# Patient Record
Sex: Female | Born: 1950 | Race: White | Hispanic: No
Health system: Southern US, Community
[De-identification: ages and names within clinical notes are randomized; demographics above are authoritative.]

---

## 2004-10-08 ENCOUNTER — Emergency Department: Payer: Self-pay | Admitting: General Practice

## 2004-11-06 ENCOUNTER — Inpatient Hospital Stay: Payer: Self-pay | Admitting: Internal Medicine

## 2004-11-06 ENCOUNTER — Other Ambulatory Visit: Payer: Self-pay

## 2005-08-12 ENCOUNTER — Emergency Department: Payer: Self-pay | Admitting: Emergency Medicine

## 2005-11-08 ENCOUNTER — Emergency Department: Payer: Self-pay | Admitting: Emergency Medicine

## 2006-01-18 ENCOUNTER — Emergency Department: Payer: Self-pay | Admitting: Emergency Medicine

## 2007-01-29 ENCOUNTER — Ambulatory Visit: Payer: Self-pay

## 2007-06-02 ENCOUNTER — Ambulatory Visit: Payer: Self-pay | Admitting: Unknown Physician Specialty

## 2007-10-19 ENCOUNTER — Emergency Department: Payer: Self-pay | Admitting: Emergency Medicine

## 2008-04-21 ENCOUNTER — Ambulatory Visit: Payer: Self-pay | Admitting: Physician Assistant

## 2008-05-18 ENCOUNTER — Inpatient Hospital Stay: Payer: Self-pay | Admitting: Internal Medicine

## 2008-06-16 ENCOUNTER — Ambulatory Visit: Payer: Self-pay | Admitting: Obstetrics and Gynecology

## 2008-07-05 ENCOUNTER — Inpatient Hospital Stay: Payer: Self-pay | Admitting: Obstetrics and Gynecology

## 2008-12-27 ENCOUNTER — Emergency Department: Payer: Self-pay | Admitting: Emergency Medicine

## 2011-03-17 ENCOUNTER — Inpatient Hospital Stay: Payer: Self-pay | Admitting: Family Medicine

## 2012-08-18 ENCOUNTER — Emergency Department: Payer: Self-pay | Admitting: Emergency Medicine

## 2012-10-13 ENCOUNTER — Inpatient Hospital Stay: Payer: Self-pay | Admitting: Internal Medicine

## 2012-10-13 LAB — URINALYSIS, COMPLETE
Bilirubin,UR: NEGATIVE
Ketone: NEGATIVE
Ph: 6 (ref 4.5–8.0)
Protein: NEGATIVE
RBC,UR: 2 /HPF (ref 0–5)
Specific Gravity: 1.012 (ref 1.003–1.030)
WBC UR: 3 /HPF (ref 0–5)

## 2012-10-13 LAB — COMPREHENSIVE METABOLIC PANEL
Alkaline Phosphatase: 108 U/L (ref 50–136)
Bilirubin,Total: 0.4 mg/dL (ref 0.2–1.0)
Chloride: 107 mmol/L (ref 98–107)
Co2: 27 mmol/L (ref 21–32)
Creatinine: 0.65 mg/dL (ref 0.60–1.30)
EGFR (African American): >60
EGFR (Non-African Amer.): >60
Glucose: 73 mg/dL (ref 65–99)
Osmolality: 281 (ref 275–301)
SGPT (ALT): 36 U/L (ref 12–78)

## 2012-10-13 LAB — CBC WITH DIFFERENTIAL/PLATELET
Basophil %: 0.5 %
Eosinophil #: 0.3 10*3/uL (ref 0.0–0.7)
Eosinophil %: 3.4 %
HCT: 40.1 % (ref 35.0–47.0)
HGB: 13.4 g/dL (ref 12.0–16.0)
Lymphocyte %: 20.3 %
MCH: 30.4 pg (ref 26.0–34.0)
Monocyte %: 7.9 %
Neutrophil %: 67.9 %
Platelet: 226 10*3/uL (ref 150–440)
RBC: 4.41 10*6/uL (ref 3.80–5.20)
WBC: 9 10*3/uL (ref 3.6–11.0)

## 2012-10-13 LAB — APTT: Activated PTT: 34.9 secs (ref 23.6–35.9)

## 2012-10-13 LAB — PROTIME-INR: Prothrombin Time: 14 secs (ref 11.5–14.7)

## 2012-10-13 LAB — CK: CK, Total: 182 U/L (ref 21–215)

## 2012-10-14 LAB — CBC WITH DIFFERENTIAL/PLATELET
Basophil #: 0.1 10*3/uL (ref 0.0–0.1)
Basophil %: 1.2 %
HCT: 37.3 % (ref 35.0–47.0)
HGB: 12.2 g/dL (ref 12.0–16.0)
Lymphocyte %: 25.8 %
MCH: 30 pg (ref 26.0–34.0)
MCHC: 32.8 g/dL (ref 32.0–36.0)
MCV: 91 fL (ref 80–100)
Monocyte %: 10.1 %
Neutrophil #: 2.8 10*3/uL (ref 1.4–6.5)
Neutrophil %: 57.1 %
RDW: 13.5 % (ref 11.5–14.5)
WBC: 4.8 10*3/uL (ref 3.6–11.0)

## 2012-10-14 LAB — BASIC METABOLIC PANEL
Calcium, Total: 8.5 mg/dL (ref 8.5–10.1)
Chloride: 111 mmol/L — ABNORMAL HIGH (ref 98–107)
EGFR (Non-African Amer.): >60
Glucose: 85 mg/dL (ref 65–99)
Potassium: 3.6 mmol/L (ref 3.5–5.1)
Sodium: 143 mmol/L (ref 136–145)

## 2012-10-15 LAB — VANCOMYCIN, TROUGH: Vancomycin, Trough: 48 ug/mL (ref 10–20)

## 2012-10-15 LAB — URINE CULTURE

## 2012-10-16 LAB — VANCOMYCIN, TROUGH: Vancomycin, Trough: 15 ug/mL (ref 10–20)

## 2013-02-01 ENCOUNTER — Emergency Department: Payer: Self-pay | Admitting: Emergency Medicine

## 2013-02-01 LAB — URINALYSIS, COMPLETE
Bacteria: NONE SEEN
Bilirubin,UR: NEGATIVE
Blood: NEGATIVE
Glucose,UR: NEGATIVE mg/dL (ref 0–75)
Ketone: NEGATIVE
Nitrite: NEGATIVE
Ph: 7 (ref 4.5–8.0)
Specific Gravity: 1.019 (ref 1.003–1.030)
Squamous Epithelial: 5
WBC UR: 1 /HPF (ref 0–5)

## 2013-02-01 LAB — COMPREHENSIVE METABOLIC PANEL
Anion Gap: 3 — ABNORMAL LOW (ref 7–16)
Chloride: 107 mmol/L (ref 98–107)
Co2: 29 mmol/L (ref 21–32)
Creatinine: 0.76 mg/dL (ref 0.60–1.30)
EGFR (African American): >60
Glucose: 92 mg/dL (ref 65–99)
Osmolality: 277 (ref 275–301)
Potassium: 4.6 mmol/L (ref 3.5–5.1)
SGOT(AST): 29 U/L (ref 15–37)
SGPT (ALT): 27 U/L (ref 12–78)
Sodium: 139 mmol/L (ref 136–145)
Total Protein: 7.3 g/dL (ref 6.4–8.2)

## 2013-02-01 LAB — CBC
HCT: 38.5 % (ref 35.0–47.0)
HGB: 12.9 g/dL (ref 12.0–16.0)
MCH: 29.9 pg (ref 26.0–34.0)
Platelet: 173 10*3/uL (ref 150–440)
WBC: 7.8 10*3/uL (ref 3.6–11.0)

## 2013-06-15 ENCOUNTER — Ambulatory Visit: Payer: Self-pay | Admitting: Orthopedic Surgery

## 2013-06-15 LAB — BASIC METABOLIC PANEL
Calcium, Total: 9.1 mg/dL (ref 8.5–10.1)
Chloride: 107 mmol/L (ref 98–107)
Co2: 28 mmol/L (ref 21–32)
Creatinine: 1 mg/dL (ref 0.60–1.30)
EGFR (African American): >60
EGFR (Non-African Amer.): >60
Glucose: 103 mg/dL — ABNORMAL HIGH (ref 65–99)
Osmolality: 283 (ref 275–301)
Potassium: 3.5 mmol/L (ref 3.5–5.1)
Sodium: 141 mmol/L (ref 136–145)

## 2013-06-15 LAB — URINALYSIS, COMPLETE
Bilirubin,UR: NEGATIVE
Blood: NEGATIVE
Hyaline Cast: 2
Nitrite: NEGATIVE
Ph: 5 (ref 4.5–8.0)
Protein: 100
RBC,UR: 10 /HPF (ref 0–5)
Specific Gravity: 1.035 (ref 1.003–1.030)
Squamous Epithelial: 11
WBC UR: 5 /HPF (ref 0–5)

## 2013-06-15 LAB — CBC
HCT: 40.7 % (ref 35.0–47.0)
MCHC: 34 g/dL (ref 32.0–36.0)
MCV: 88 fL (ref 80–100)
Platelet: 251 10*3/uL (ref 150–440)
RDW: 13.8 % (ref 11.5–14.5)
WBC: 6.7 10*3/uL (ref 3.6–11.0)

## 2013-06-15 LAB — SEDIMENTATION RATE: Erythrocyte Sed Rate: 2 mm/hr (ref 0–30)

## 2013-06-15 LAB — APTT: Activated PTT: 30.7 secs (ref 23.6–35.9)

## 2013-06-15 LAB — PROTIME-INR
INR: 1
Prothrombin Time: 13.7 secs (ref 11.5–14.7)

## 2013-06-25 ENCOUNTER — Inpatient Hospital Stay: Payer: Self-pay | Admitting: Orthopedic Surgery

## 2013-06-25 LAB — HEMOGLOBIN
HGB: 11.9 g/dL — ABNORMAL LOW (ref 12.0–16.0)
HGB: 12.1 g/dL (ref 12.0–16.0)

## 2013-06-26 LAB — BASIC METABOLIC PANEL
Anion Gap: 4 — ABNORMAL LOW (ref 7–16)
Chloride: 111 mmol/L — ABNORMAL HIGH (ref 98–107)
Co2: 26 mmol/L (ref 21–32)
Creatinine: 0.58 mg/dL — ABNORMAL LOW (ref 0.60–1.30)
Glucose: 111 mg/dL — ABNORMAL HIGH (ref 65–99)
Osmolality: 280 (ref 275–301)
Potassium: 3.8 mmol/L (ref 3.5–5.1)
Sodium: 141 mmol/L (ref 136–145)

## 2013-06-26 LAB — HEMOGLOBIN: HGB: 10.7 g/dL — ABNORMAL LOW (ref 12.0–16.0)

## 2013-06-26 LAB — PLATELET COUNT: Platelet: 128 10*3/uL — ABNORMAL LOW (ref 150–440)

## 2013-08-12 ENCOUNTER — Ambulatory Visit: Payer: Self-pay | Admitting: Orthopedic Surgery

## 2013-09-05 ENCOUNTER — Emergency Department: Payer: Self-pay | Admitting: Emergency Medicine

## 2013-09-05 LAB — LIPASE, BLOOD: Lipase: 221 U/L (ref 73–393)

## 2013-09-05 LAB — COMPREHENSIVE METABOLIC PANEL
Albumin: 3.7 g/dL (ref 3.4–5.0)
Alkaline Phosphatase: 104 U/L (ref 50–136)
Calcium, Total: 8.6 mg/dL (ref 8.5–10.1)
Co2: 24 mmol/L (ref 21–32)
Creatinine: 0.84 mg/dL (ref 0.60–1.30)
EGFR (African American): >60
EGFR (Non-African Amer.): >60
Osmolality: 278 (ref 275–301)
SGOT(AST): 35 U/L (ref 15–37)
SGPT (ALT): 34 U/L (ref 12–78)
Total Protein: 7.2 g/dL (ref 6.4–8.2)

## 2013-09-05 LAB — CBC
HCT: 37.8 % (ref 35.0–47.0)
HGB: 12.4 g/dL (ref 12.0–16.0)
MCH: 27.4 pg (ref 26.0–34.0)
MCV: 84 fL (ref 80–100)
Platelet: 177 10*3/uL (ref 150–440)
RBC: 4.52 10*6/uL (ref 3.80–5.20)
RDW: 15.4 % — ABNORMAL HIGH (ref 11.5–14.5)
WBC: 14.3 10*3/uL — ABNORMAL HIGH (ref 3.6–11.0)

## 2013-09-07 LAB — COMPREHENSIVE METABOLIC PANEL
Alkaline Phosphatase: 96 U/L (ref 50–136)
Bilirubin,Total: 0.6 mg/dL (ref 0.2–1.0)
Calcium, Total: 8.8 mg/dL (ref 8.5–10.1)
Chloride: 105 mmol/L (ref 98–107)
Co2: 27 mmol/L (ref 21–32)
Creatinine: 0.8 mg/dL (ref 0.60–1.30)
EGFR (African American): >60
EGFR (Non-African Amer.): >60
Osmolality: 271 (ref 275–301)
Potassium: 3.6 mmol/L (ref 3.5–5.1)
SGOT(AST): 31 U/L (ref 15–37)
SGPT (ALT): 30 U/L (ref 12–78)

## 2013-09-07 LAB — CBC
HGB: 11.8 g/dL — ABNORMAL LOW (ref 12.0–16.0)
MCH: 27.2 pg (ref 26.0–34.0)
MCHC: 32.5 g/dL (ref 32.0–36.0)
RBC: 4.32 10*6/uL (ref 3.80–5.20)

## 2013-09-07 LAB — URINALYSIS, COMPLETE
Bilirubin,UR: NEGATIVE
Ketone: NEGATIVE
Nitrite: NEGATIVE
Ph: 7 (ref 4.5–8.0)
Protein: NEGATIVE
RBC,UR: 3 /HPF (ref 0–5)
Specific Gravity: 1.004 (ref 1.003–1.030)
WBC UR: 7 /HPF (ref 0–5)

## 2013-09-07 LAB — LIPASE, BLOOD: Lipase: 81 U/L (ref 73–393)

## 2013-09-08 ENCOUNTER — Inpatient Hospital Stay: Payer: Self-pay | Admitting: Surgery

## 2013-09-08 LAB — CBC WITH DIFFERENTIAL/PLATELET
Basophil %: 0.4 %
Eosinophil #: 0.1 10*3/uL (ref 0.0–0.7)
Eosinophil %: 0.5 %
HCT: 31.3 % — ABNORMAL LOW (ref 35.0–47.0)
HGB: 10.2 g/dL — ABNORMAL LOW (ref 12.0–16.0)
Lymphocyte #: 1.1 10*3/uL (ref 1.0–3.6)
Lymphocyte %: 10.3 %
MCH: 27.3 pg (ref 26.0–34.0)
MCHC: 32.7 g/dL (ref 32.0–36.0)
Monocyte #: 0.9 x10 3/mm (ref 0.2–0.9)
Monocyte %: 8.7 %
Neutrophil #: 8.4 10*3/uL — ABNORMAL HIGH (ref 1.4–6.5)
Neutrophil %: 80.1 %
Platelet: 166 10*3/uL (ref 150–440)
RBC: 3.75 10*6/uL — ABNORMAL LOW (ref 3.80–5.20)
RDW: 15.2 % — ABNORMAL HIGH (ref 11.5–14.5)
WBC: 10.4 10*3/uL (ref 3.6–11.0)

## 2013-09-09 LAB — CBC WITH DIFFERENTIAL/PLATELET
Basophil #: 0 10*3/uL (ref 0.0–0.1)
Basophil %: 0.3 %
Eosinophil %: 2.5 %
HGB: 9.3 g/dL — ABNORMAL LOW (ref 12.0–16.0)
Lymphocyte #: 0.9 10*3/uL — ABNORMAL LOW (ref 1.0–3.6)
Lymphocyte %: 12.6 %
MCH: 27.8 pg (ref 26.0–34.0)
MCHC: 33.2 g/dL (ref 32.0–36.0)
Monocyte %: 11.3 %
Neutrophil %: 73.3 %
Platelet: 156 10*3/uL (ref 150–440)
RBC: 3.34 10*6/uL — ABNORMAL LOW (ref 3.80–5.20)
WBC: 7 10*3/uL (ref 3.6–11.0)

## 2013-09-09 LAB — BASIC METABOLIC PANEL
Anion Gap: 4 — ABNORMAL LOW (ref 7–16)
BUN: 4 mg/dL — ABNORMAL LOW (ref 7–18)
Calcium, Total: 8.1 mg/dL — ABNORMAL LOW (ref 8.5–10.1)
Chloride: 104 mmol/L (ref 98–107)
Creatinine: 0.76 mg/dL (ref 0.60–1.30)
Sodium: 136 mmol/L (ref 136–145)

## 2013-09-10 LAB — CBC WITH DIFFERENTIAL/PLATELET
Basophil #: 0 10*3/uL (ref 0.0–0.1)
Eosinophil #: 0.3 10*3/uL (ref 0.0–0.7)
Eosinophil %: 4.7 %
HGB: 8.9 g/dL — ABNORMAL LOW (ref 12.0–16.0)
Lymphocyte %: 11.9 %
MCH: 27.5 pg (ref 26.0–34.0)
MCHC: 32.9 g/dL (ref 32.0–36.0)
Monocyte %: 12.4 %
Neutrophil #: 4.5 10*3/uL (ref 1.4–6.5)
Neutrophil %: 70.7 %
RBC: 3.24 10*6/uL — ABNORMAL LOW (ref 3.80–5.20)
RDW: 15.5 % — ABNORMAL HIGH (ref 11.5–14.5)

## 2013-09-10 LAB — PATHOLOGY REPORT

## 2013-09-13 LAB — CREATININE, SERUM
Creatinine: 0.84 mg/dL (ref 0.60–1.30)
EGFR (African American): >60

## 2013-09-14 LAB — CBC WITH DIFFERENTIAL/PLATELET
Basophil #: 0.1 10*3/uL (ref 0.0–0.1)
Basophil %: 0.9 %
Eosinophil %: 5.1 %
Lymphocyte #: 1.2 10*3/uL (ref 1.0–3.6)
MCH: 27.4 pg (ref 26.0–34.0)
MCV: 83 fL (ref 80–100)
Neutrophil %: 64.5 %
Platelet: 239 10*3/uL (ref 150–440)

## 2013-09-14 LAB — URINALYSIS, COMPLETE
Bilirubin,UR: NEGATIVE
Glucose,UR: NEGATIVE mg/dL (ref 0–75)
Ketone: NEGATIVE
Nitrite: NEGATIVE
RBC,UR: 16 /HPF (ref 0–5)
Specific Gravity: 1.008 (ref 1.003–1.030)
Squamous Epithelial: 4
WBC UR: 1206 /HPF (ref 0–5)

## 2013-10-16 IMAGING — CT CT OF THE LEFT KNEE WITHOUT CONTRAST
1 of 3 series · 8 of 14 positions shown, 10 images · non-contrast
Comparison: none

REASON FOR EXAM: MY KNEE CT Left knee pain Surgery planning
COMMENTS:

PROCEDURE:     KCT - KCT KNEE LEFT WO CONTRAST  - August 12, 2013  [DATE]
RESULT:
TECHNIQUE: Helical imaging was performed per a MY CT protocol.

[Series 3: knee 1.0 b30s · axial · 0.39mm/px · z∈[-200,-46]mm · 8 of 395 slices shown, 10 images]
[im 44/395  soft-tissue]
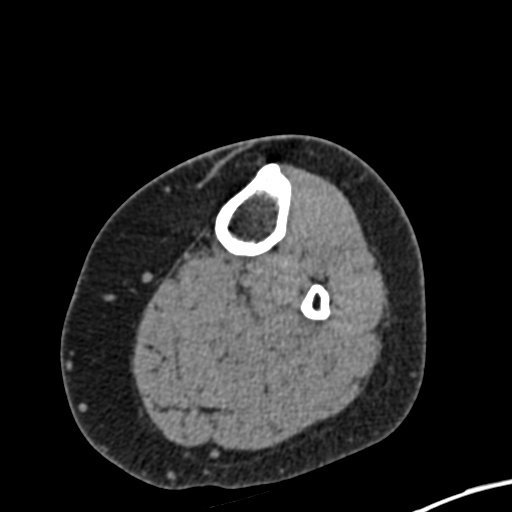
[im 44/395  bone]
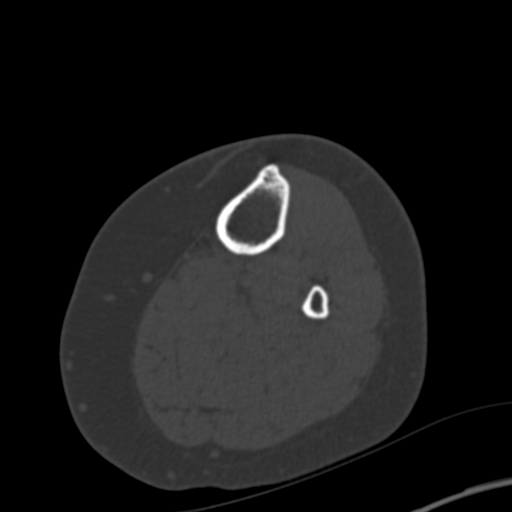
[im 88/395  bone]
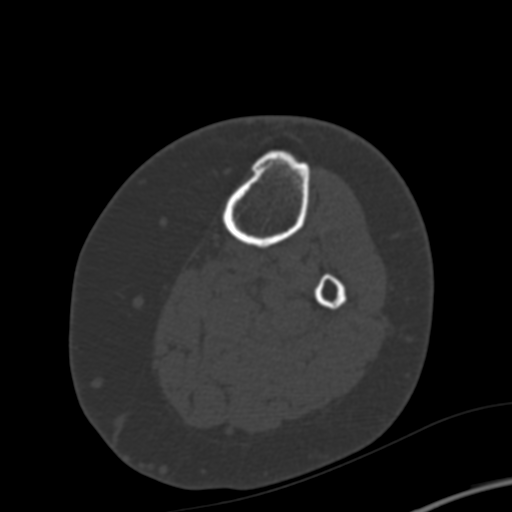
[im 132/395  bone]
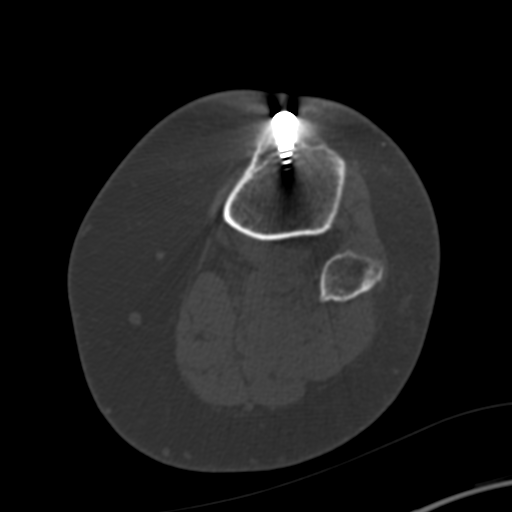
[im 176/395  bone]
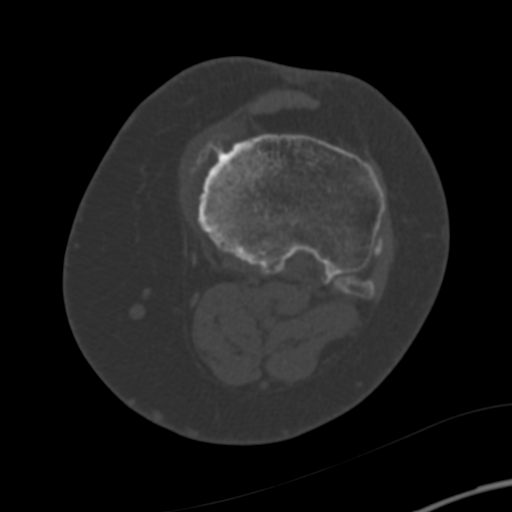
[im 219/395  soft-tissue]
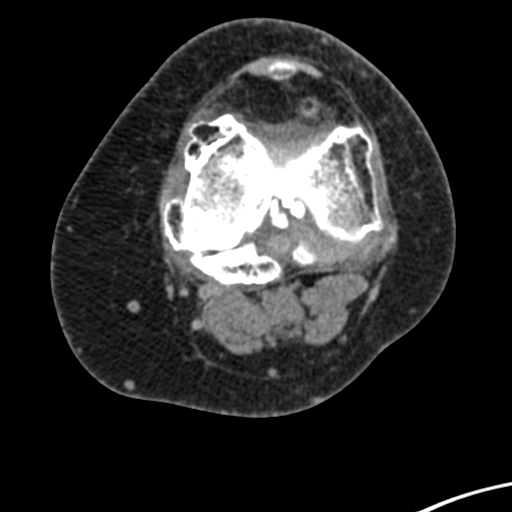
[im 219/395  bone]
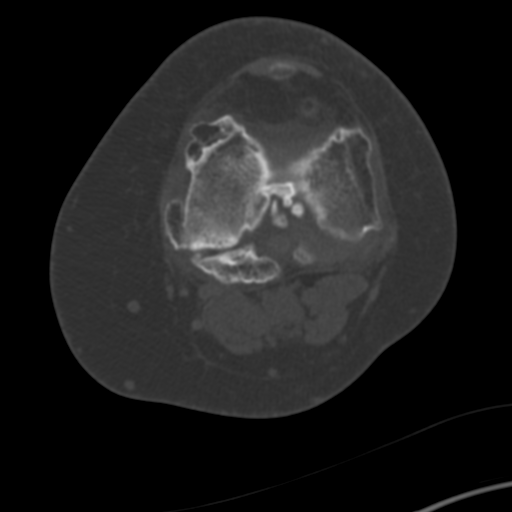
[im 263/395  bone]
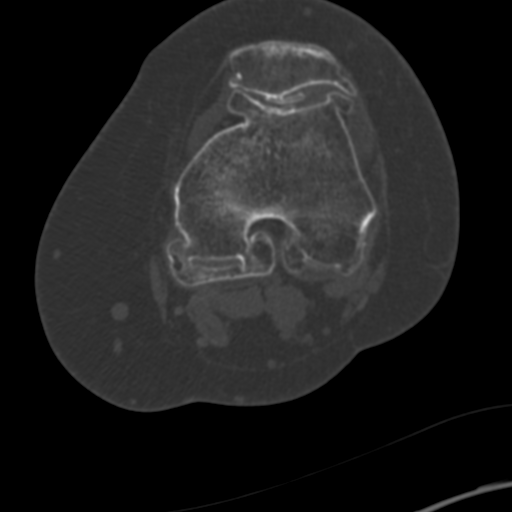
[im 307/395  bone]
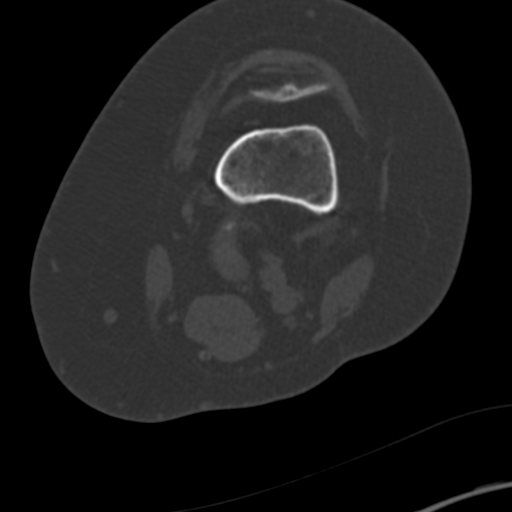
[im 351/395  bone]
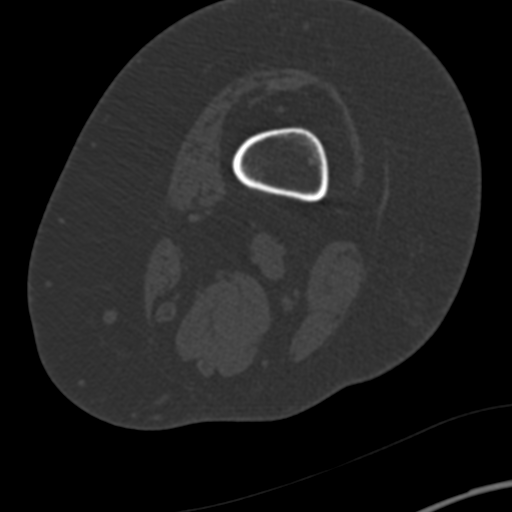

[8 of 14 positions shown; findings below may reference images not displayed]

FINDINGS: Severe degenerative changes are identified within the left knee.
Post operative screw is identified within the tibia. Degenerative changes
are identified within the left foot and ankle. There is no evidence of lytic
or blastic foci. Mild degenerative changes are identified within the left
hip.
IMPRESSION: Left knee CT per a MY knee protocol.

## 2013-10-26 ENCOUNTER — Ambulatory Visit: Payer: Self-pay | Admitting: Orthopedic Surgery

## 2013-10-26 LAB — BASIC METABOLIC PANEL
Anion Gap: 4 — ABNORMAL LOW (ref 7–16)
BUN: 7 mg/dL (ref 7–18)
Calcium, Total: 8.6 mg/dL (ref 8.5–10.1)
Chloride: 105 mmol/L (ref 98–107)
Co2: 30 mmol/L (ref 21–32)
Creatinine: 0.81 mg/dL (ref 0.60–1.30)
EGFR (African American): >60
EGFR (Non-African Amer.): >60
Glucose: 80 mg/dL (ref 65–99)
Sodium: 139 mmol/L (ref 136–145)

## 2013-10-26 LAB — URINALYSIS, COMPLETE
Blood: NEGATIVE
Glucose,UR: NEGATIVE mg/dL (ref 0–75)
Hyaline Cast: 3
RBC,UR: 3 /HPF (ref 0–5)
Squamous Epithelial: 1

## 2013-10-26 LAB — MRSA PCR SCREENING

## 2013-10-26 LAB — CBC
MCHC: 31.5 g/dL — ABNORMAL LOW (ref 32.0–36.0)
RDW: 16.3 % — ABNORMAL HIGH (ref 11.5–14.5)
WBC: 7 10*3/uL (ref 3.6–11.0)

## 2013-10-26 LAB — SEDIMENTATION RATE: Erythrocyte Sed Rate: 8 mm/hr (ref 0–30)

## 2013-11-10 ENCOUNTER — Inpatient Hospital Stay: Payer: Self-pay | Admitting: Orthopedic Surgery

## 2013-11-11 LAB — BASIC METABOLIC PANEL
BUN: 6 mg/dL — ABNORMAL LOW (ref 7–18)
Chloride: 108 mmol/L — ABNORMAL HIGH (ref 98–107)
Co2: 28 mmol/L (ref 21–32)
EGFR (African American): >60
EGFR (Non-African Amer.): >60
Potassium: 3 mmol/L — ABNORMAL LOW (ref 3.5–5.1)
Sodium: 141 mmol/L (ref 136–145)

## 2013-11-11 LAB — PLATELET COUNT: Platelet: 155 10*3/uL (ref 150–440)

## 2013-11-12 LAB — HEMOGLOBIN
HGB: 8.1 g/dL — ABNORMAL LOW (ref 12.0–16.0)
HGB: 8.7 g/dL — ABNORMAL LOW (ref 12.0–16.0)

## 2013-11-12 LAB — POTASSIUM: Potassium: 3.2 mmol/L — ABNORMAL LOW (ref 3.5–5.1)

## 2013-11-13 LAB — PATHOLOGY REPORT

## 2015-03-01 NOTE — Discharge Summary (Signed)
PATIENT NAME:  Kristen Nash, Kristen Nash MR#:  [id number] DATE OF BIRTH:  11-26-1950  DATE OF ADMISSION:  10/13/2012 DATE OF DISCHARGE:  10/17/2012  DISCHARGE DIAGNOSES:  1. Sepsis.  2. Cellulitis lower abdominal wall.  3. Vulvar abscess.  4. Hypercholesterolemia.  5. Depression.   DISCHARGE MEDICATIONS: Bactrim DS one p.o. b.i.d. x10 days. This is a new medication. Otherwise she can take: 1. Hydrocodone/Tylenol p.r.n.  for pain she has at home.  2. Continue on Celexa 40 mg daily.  3. Tylenol p.r.n.  4. Trazodone 150 at bedtime for sleep.   HISTORY AND PHYSICAL: Please see detailed History and Physical done on admission.   HOSPITAL COURSE: The patient was admitted as above, hypotensive, somewhat confused with rapidly spreading cellulitis. She was started on vancomycin and Zosyn given her history of MRSA infections. She defervesced quickly from a temperature of approximately 102 per the patient noted in the office. Fluid bolus corrected her hypotension well. Her pulse slowed down. She did very well. Gynecology was consulted and ultimately an I and D procedure was done on the abscess with a Penrose drain placed. The size of this decreased substantially. Dr. Logan BoresEvans wanted early followup of this lesion, though the drain did come out within 24 hours. The abscess was still draining, so another was not placed. She had Escherichia coli that was pansensitive growing in her urine. She had staph aureus which was methicillin resistant from the wound culture. Luckily, it was sensitive to Septra and she could take that. She will be discharged on that. Proteus from the wound culture was also sulfa sensitive. MVC was down to 4800 on today's date with the rest of the CBC being normal as well. She will follow up with me as previously planned. Dr. Logan BoresEvans will monitor this local infection further.     TIME SPENT:   It took approximately 32 minutes to do all discharge tasks today.   ____________________________ Marya AmslerMarshall W.  Dareen PianoAnderson, MD mwa:cbb D: 10/17/2012 08:04:19 ET T: 10/17/2012 12:36:57 ET JOB#: [id number]  cc: Marya AmslerMarshall W. Dareen PianoAnderson, MD, <Dictator> Lauro RegulusMARSHALL W Carmencita Cusic MD ELECTRONICALLY SIGNED 10/18/2012 9:51

## 2015-03-01 NOTE — Op Note (Signed)
PATIENT NAME:  Kristen Nash, Kristen Nash MR#:  [id number] DATE OF BIRTH:  [birth year]  DATE OF PROCEDURE:  10/15/2012  PREOPERATIVE DIAGNOSIS: Right vulvar abscess, hospital day two, on IV antibiotics.  POSTOPERATIVE DIAGNOSIS: Right vulvar abscess, hospital day two, on IV antibiotics.  PROCEDURE: Incision and drainage with placement of Penrose drain.   SURGEON: Ricky L. Logan BoresEvans, MD  ANESTHESIA: General orotracheal.   FINDINGS: Right vulvar abscess cavity shown to be approximately 7 cm in the AP plane and approximately 2 cm deep.  ESTIMATED BLOOD LOSS: Minimal.   COMPLICATIONS: None.   SPECIMENS: None sent for pathology.  INDICATION: The patient was admitted two days ago by the Medicine service for vulvar abscess with cellulitis and otherwise fairly healthy lady. She was having some purulent drainage and on discharge felt to have some fluctuance and probable mass and elected to proceed with the above procedure. Consent was signed.   DESCRIPTION OF PROCEDURE: She was taken to Operating Room and placed in the supine position where anesthesia was initiated. We then placed in shallow lithotomy position using Allen stirrups due to the patient's orthopedic surgeries on her knees.    She was prepped and draped in the usual fashion. Right vulva was visualized. The area of drainage was seen to be approximately 2 to 3 mm. This was extended to approximately a centimeter using a #15 blade and the wound was explored with hemostats. There was some clumpy purulent type material expressed and then a significant amount of serous fluid behind this. None of this was sent for permanent. Loculations were broken up, Sterile saline used to irrigate cavity until return was clear.  Penrose was placed, anchored in placed with 3-0 silk, and the areas were seen to be hemostatic.   At this point, the procedure was felt to achieve maximum efficacy. She was returned to the supine position, left in the care of anesthesia. I anticipate  a routine postoperative course.  She is on antibiotics and no additional antibiotics were given for the procedure. She is okay to go home for a GYN standpoint, and I will see her back in a week to remove this drain. ____________________________ Clide Clifficky L. Logan BoresEvans, MD rle:slb D: 10/15/2012 10:11:36 ET T: 10/15/2012 11:21:46 ET JOB#: [id number]  cc: Ricky L. Logan BoresEvans, MD, <Dictator> Augustina MoodICK L Deiondre Harrower MD ELECTRONICALLY SIGNED 10/17/2012 7:21

## 2015-03-01 NOTE — H&P (Signed)
PATIENT NAME:  Kristen SchlichterCOLLIER, Keaunna C MR#:  [id number] DATE OF BIRTH:  [birth year]  DATE OF ADMISSION:  10/13/2012  CHIEF COMPLAINT: Abdominal discomfort and swelling.   HISTORY OF PRESENT ILLNESS: The patient is a 64 year old female who was noted to have redness and swelling in her right groin over the last several days. This area has gotten larger, more tender. She has noticed some purulent material draining from it and had temperature up to 101.4 this morning. She has felt generalized malaise. No nausea, vomiting, no change in bowels, bladder. Trauma to the area. Evaluation revealed evidence of a right labial abscess with erythema extending up the abdominal wall; she was also noted to have blood pressure 82/54. She is admitted now with right labial abscess, abdominal wall cellulitis, and hypotension which is worrisome for a systemic inflammatory response syndrome versus impending sepsis. She is admitted for IV antibiotics, IV fluids, and Gynecology consultation and potential incision and drainage.   PAST MEDICAL HISTORY:  1. Depression.  2. History of headaches.  3. Allergic rhinitis.  4. Hyperlipidemia.   PAST SURGICAL HISTORY:  1. Avascular necrosis of right femur.  2. History of left knee surgery.  3. Reconstruction of pelvis August 2009.    ALLERGIES: Biaxin causes chest pain.   MEDICATIONS:  1. Aspirin 325 mg daily as needed.  2. Citalopram 40 mg p.o. daily.  3. Pravastatin 20 mg p.o. daily.  4. Vicodin 5/500 mg, 1 p.o. b.i.d. p.r.n.  5. Trazodone 150 mg p.o. at bedtime.  6. Lorazepam 0.25 mg p.o. t.i.d. p.r.n.  SOCIAL HISTORY: No alcohol or tobacco.   FAMILY HISTORY: Chronic obstructive pulmonary disease, Alzheimer's.   REVIEW OF SYSTEMS: Please see history of present illness. She denies chest pain, shortness of breath. No recent unusual headaches. No other rash. The remainder of complete review of systems is negative.   PHYSICAL EXAMINATION:  VITAL SIGNS: Weight 201, blood pressure  82/54, temperature 98.5, pulse 74.   GENERAL: Well-developed, well-nourished female, appears uncomfortable.   HEENT: Eyes: Pupils are round and reactive to light. Lids and conjunctivae are unremarkable. Ears, nose and throat: External examination unremarkable. The oropharynx is moist without lesions.   NECK: Supple, trachea midline. No thyromegaly.   CARDIOVASCULAR: Regular rate and rhythm without gallops or rubs. Carotid and radial pulses 1+.   LUNGS: Clear bilaterally. No wheeze or retraction.   ABDOMEN: Soft, nondistended, positive bowel sounds. Erythema extending up from the abdomen.   GENITALIA: Extensive swelling of the right labia majora with a single area of drainage. Discomfort throughout the area with palpation and evidence of the extensive erythema consistent with cellulitis.   LYMPH NODES: No inguinal nodes.   EXTREMITIES: No clubbing, cyanosis, or edema.   NEUROLOGIC: Cranial nerves intact. Motor strength appears to be grossly symmetrical.     IMPRESSION AND PLAN: Right labial abscess, abdominal wall cellulitis: The area is worrisome for impending sepsis and potential superficial spread. The patient also is with history of MRSA.  We will place her on vancomycin, Zosyn. Pharmacy consultation for assistance. Gynecology assistance to evaluate for potential need for incision and drainage. We defer for imaging to their office. Blood cultures are pending. We will obtain wound culture as well. Hold aspirin for now in the event the patient needs to go for a surgical debridement as noted above.   ____________________________ Lynnea FerrierBert J. Riley Papin III, MD bjk:cbb D: 10/13/2012 15:15:53 ET T: 10/13/2012 15:51:34 ET JOB#: [id number]  cc: Lynnea FerrierBert J. Kolin Erdahl III, MD, <Dictator> Daniel NonesBERT Geneieve Duell MD ELECTRONICALLY SIGNED  10/15/2012 12:51 

## 2015-03-01 NOTE — H&P (Signed)
    Subjective/Chief Complaint 64 y/o WF Admitted yeaterday bt [facility] for right-sided labial abscess Pt had temp 101+ and depressed BP -- concern for sepsis    History of Present Illness Bump on right vulva first noted 5 days ago Over next few days, rapidly increased to golf-ball size with pain.  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  left fractured knee:   tonsillectomy for OSA:   ALLERGIES:  Biaxin: Resp. Distress  "some eye drops used during surgery caused eye swelling": Unknown  Review of Systems:   Subjective/Chief Complaint vulvatr/pubic pain with purilent drainage    Fever/Chills Yes    Cough No    Sputum No    Abdominal Pain Yes    Diarrhea No    Constipation No    Nausea/Vomiting Yes    SOB/DOE No    Chest Pain No    Dysuria No    Tolerating Diet Yes    ROS all normal except as above    Medications/Allergies Reviewed Medications/Allergies reviewed   Physical Exam:   GEN no acute distress    HEENT PERRL    NECK supple    RESP normal resp effort    CARD regular rate    ABD denies tenderness    GU superpubic tenderness  purilent drainage from right majus    LYMPH negative neck, + at right groin    EXTR negative cyanosis/clubbing    PSYCH A+O to time, place, person     Assessment/Admission Diagnosis Draining right vuvlar abscess    Plan I&D tomorrow am in OR Continue Abx Consider min. 2 weeks abx after D/C Will recheck in my office 2 weeks   Electronic Signatures: Margaretha GlassingEvans, Ricky L (MD)  (Signed 03-Dec-13 12:03)  Authored: CHIEF COMPLAINT and HISTORY, PAST MEDICAL/SURGIAL HISTORY, ALLERGIES, REVIEW OF SYSTEMS, PHYSICAL EXAM, ASSESSMENT AND PLAN   Last Updated: 03-Dec-13 12:03 by Margaretha GlassingEvans, Ricky L (MD)

## 2015-03-04 NOTE — H&P (Signed)
PATIENT NAME:  Kristen, Nash MR#:  [id number] DATE OF BIRTH:  1951/02/20  DATE OF ADMISSION:  09/07/2013  PRIMARY CARE PHYSICIAN: Einar Crow.   ADMITTING PHYSICIAN: Dr. Michela Pitcher.   CHIEF COMPLAINT: Abdominal pain.   BRIEF HISTORY:  Kristen Nash is a 64 year old woman seen in the Emergency Room with three-day history of abdominal pain. She went to her church service on Saturday morning, went to a [employer], which was an all vegetarian meal, had no significant problems. Went to visit a friend that afternoon, and while talking to the friend, had sudden onset of pain in her right lower quadrant. She said it felt "as though she had been stabbed."   The pain was so severe she collapsed with likely vasovagal attack.  She presented to the emergency room for further evaluation.  She had an elevated white blood cell count of 15,000.  EKG was largely unremarkable. CT scan without p.o. contrast was performed, which was unremarkable. It did show some retention of stool on the right side, but no obvious abnormality. There were no inflammatory changes noted. The gallbladder was unremarkable. She was sent home on pain medication. The pain was severe over the last 48 hours, and she began to develop a fever last evening. This morning she woke with a temperature of 103. She presented back to the Emergency Room for further evaluation and has been in our emergency room essentially all day. White blood cell count at this point was normal. Repeat CT scan with contrast reveals what appears to be free air in the right lower quadrant/right periumbilical area. She does have a small fat-filled hernia, and there appears to be some free air in that in addition. There also appears to be a foreign body in the bowel, which may have penetrated the bowel wall. The CT is being read as possible bowel perforation, possible enteritis. The CT scan from the 25th was reviewed, and there did appear to be a similar foreign body unexplained  object on those films.   She had no significant abdominal history previously. She denies history of hepatitis, yellow jaundice, pancreatitis, peptic ulcer disease, gallbladder disease or diverticulitis. She did have a history of a vulvar abscess, which caused significant abdominal wall cellulitis a year ago. She also has history of vaginal hysterectomy. She does still have her ovaries. She denies any cardiac disease, hypertension, diabetes or thyroid disease. She does have a history of depression. She also has history of irritable bowel syndrome.   CURRENT MEDICATIONS: Include Bentyl 20 mg q.i.d. p.r.n., Celexa 60 mg once a day, senna 1 tablet twice a day, lactulose 10 grams in 15 mL, 30 mL once a day p.r.n.; loratadine 10 mg once a day, lorazepam 0.25 mg q.6 hours p.r.n., lovastatin 20 mg once a day, lysine 1000 mg once a day, magnesium hydroxide 8% oral suspension 30 mL twice a day.  She is also on probiotic and trazodone 150 mg 1.5 tablets at bedtime.   She is allergic to [facility].   She is not a cigarette smoker and does not drink alcohol. She denies any drug history.   REVIEW OF SYSTEMS:  Otherwise unremarkable.   FAMILY HISTORY: Noncontributory.   PHYSICAL EXAMINATION: GENERAL: She is pale, washed out, ill-appearing woman, mildly diaphoretic.  VITALS: Blood pressure is 178/84, heart rate is 88 and regular. She is afebrile currently.  HEENT: No scleral icterus. No pupillary abnormalities. No facial deformities.  NECK: Supple, nontender with midline trachea, and no adenopathy.  CHEST: Clear with  no adventitious sounds, although she has very distant breath sounds. She has normal pulmonary excursion, but does have abdominal pain with deep breathing.    CARDIAC: Reveals no murmurs or gallops to my ear and seems to be in normal sinus rhythm.  ABDOMEN: Markedly tense and distended with mass effect in the right lower quadrant. She has rebound, guarding, hypoactive bowel sounds. She has referred  rebound to the right lower quadrant.  EXTREMITIES: Lower extremity exam reveals full range of motion, slight edema in both ankles and good distal pulses.  PSYCHIATRIC: Reveals probable depressive affect with normal orientation.   I have independently reviewed her CT scan and discussed the scan findings with the radiologist. I do believe she has a small bowel perforation, likely in the distal third of the small bowel. There is free air, inflammatory change and a foreign body; explanation appears to be the most reasonable diagnosis this time. I think we will proceed urgently with surgical intervention with a laparotomy and the repair of the bowel perforation. I suspect this is small bowel and that she will not likely need a colostomy. I anticipate she may need a small bowel resection. This plan has been discussed with the patient, and she is in agreement. Risks and benefits of that have been outlined and accepted.      ____________________________ Carmie Endalph L. Ely III, MD rle:dmm D: 09/07/2013 21:45:26 ET T: 09/07/2013 22:09:39 ET JOB#: [id number]  cc: Carmie Endalph L. Ely III, MD, <Dictator> Marya AmslerMarshall W. Dareen PianoAnderson, MD Quentin OreALPH L ELY MD ELECTRONICALLY SIGNED 09/08/2013 20:35

## 2015-03-04 NOTE — Op Note (Signed)
PATIENT NAME:  Kristen SchlichterCOLLIER, Deshana C MR#:  [id number] DATE OF BIRTH:  [birth year]  DATE OF PROCEDURE:  11/10/2013  PREOPERATIVE DIAGNOSIS: Severe left knee osteoarthritis.   POSTOPERATIVE DIAGNOSIS: Severe left knee osteoarthritis.   PROCEDURE: Left total knee replacement.   ANESTHESIA: Spinal.   SURGEON: Leitha SchullerMichael J. Levana Minetti, M.D.   ASSISTANT: Cranston Neighborhris Gaines, PA-C.   DESCRIPTION OF PROCEDURE: The patient was brought to the operating room and after adequate spinal anesthesia was obtained, the left leg was prepped and draped in the usual sterile fashion with a bump underneath the left buttock to internally rotate the leg and a tourniquet applied to the upper thigh. After prepping and draping, the leg was exsanguinated with an Esmarch and tourniquet raised to 300 mmHg after completing appropriate patient identification and timeout procedures. The knee was opened using an essentially lateral parapatellar incision. She had had a prior lateral incision for a patellar realignment procedure, and this was utilized, incorporated and extended proximally and distally with a thick flap created. Lifting this flap medially off the capsule, a thick flap was maintained. A medial parapatellar arthrotomy was then performed as there had been a previous medial reefing apparently with nonabsorbable suture still present. Inspection of the knee revealed severe degenerative change throughout the knee. There was extensive scarring from her prior surgery. Her range of motion at the start of the case was approximately a 20-degree flexion contracture to approximately 35 to 40 degrees of flexion. After mobilizing the patella, it could be retracted laterally enough to expose the notch. The ACL was excised, along with the fat pad. With the knee still in an extended position, the proximal tibia cutting guide was applied and alignment checked and the proximal tibia cut carried out, resection matching the tibial template. At this point, with the  resection, we could get the knee flexed enough to apply the femoral cutting guide with pins placed and carrying out the distal resection, with an additional 2 mm resection to allow for adequate extension gap. A 3 cutting block was then applied, anterior and posterior chamfer cuts made without notching. At this point, large posterior osteophytes were removed off the distal femur medially and laterally, especially medially, as well as a large osteophyte on the posteromedial aspect of the tibia. A previous screw in the tibiaol tubercle was removed without difficulty. The residual meniscal horns were excised, along with the PCL to allow for extension. The tibial trial was placed. This was a size 3. Proximal drill hole made and then keel punch placed, followed by the 3 femur. The knee appeared stable with good patellofemoral tracking with a 12 mm insert. Next, the distal femoral drill holes were made, and the template for the distal femoral trochlear cut was placed and this cut made. These trial components were then removed. The patella was cut with the patellar cutting guide after removing extensive osteophytes around the patella. The patella sized to a size 2 after drill holes were made. At this point, the tourniquet was let down and hemostasis checked with electrocautery. The bony surfaces thoroughly irrigated and dried. Local anesthetic with Exparel diluted with saline as well as Toradol, morphine and Marcaine for initial postop pain relief were placed in the periarticular tissues. The tibial component was cemented into place first, with excess cement removed, followed by placing of the 12 mm tibial insert and its set screw. The femoral component was impacted into place and the knee held in extension. Full extension was obtained with the patellar button clamped into  place again with cement. The knee was left alone until the cement had set. Excess cement then being removed. The patellofemoral joint tracked well with  no-touch technique with full extension and 100 degrees of flexion obtained. The arthrotomy was repaired using Ethibond suture at the proximal and distal poles of the patella, followed by a heavy quill suture for the capsule, #1 Vicryl for the defect in the lateral capsule at the level of the patellar tendon. The subcutaneous tissue was closed with a small quill suture, with a subcutaneous drain being placed with the large flap. Skin staples then placed. Xeroform, 4 x 4's, ABD, Webril, Polar Care and Ace wrap applied, and the patient was sent to the recovery room in stable condition.   ESTIMATED BLOOD LOSS: 150.   TOURNIQUET TIME: 107 minutes.   IMPLANTS: GMK Sphere primary left femur size 3, left 3 fixed tibial component with a 12 mm flex insert and a size 2 patella from [employer].   SPECIMEN: Cut ends of bone.   CONDITION: Recovery room, stable.   There were no complications.   ____________________________ Leitha Schuller, MD mjm:gb D: 11/10/2013 21:50:41 ET T: 11/11/2013 01:19:08 ET JOB#: [id number]  cc: Leitha Schuller, MD, <Dictator> Leitha Schuller MD ELECTRONICALLY SIGNED 11/11/2013 12:16

## 2015-03-04 NOTE — Discharge Summary (Signed)
PATIENT NAME:  Kristen Nash, THERRIEN MR#:  [id number] DATE OF BIRTH:  June 18, 1951  DATE OF ADMISSION:  06/25/2013 DATE OF DISCHARGE:  06/28/2013  ADMITTING DIAGNOSIS: Right hip severe osteoarthritis, possible avascular necrosis.   DISCHARGE DIAGNOSIS: Right hip severe osteoarthritis, possible avascular necrosis.   PROCEDURE: Right total hip replacement.   SURGEON: Leitha Schuller, M.D.   ASSISTANT: Devota Pace, NP.   ANESTHESIA: Spinal.   COMPLICATIONS: None.   SPECIMENS: Femoral head.   ESTIMATED BLOOD LOSS: 1100 mL.  IMPLANTS: Medacta dual mobility liner for a 50 mm DM Versafit cup with a size 3 standard AMIS stem and an S28 mm femoral head.   CONDITION: To recovery room stable.   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 She has had significant degenerative changes and is currently walking on crutches. She also has significant left knee osteoarthritis and will later need total knee replacement. She is having pain at rest and pain with any weight-bearing. She had become a minimal ambulator secondary to this pain.    PHYSICAL EXAMINATION:  LUNGS: Clear to auscultation.  HEART: Regular rate and rhythm.  HEENT: Normal.  On exam she has 0 degrees internal rotation 10 degrees of external rotation, 20 degree hip flexion contracture. Skin is intact about the hip. She is neurovascularly intact distally.   HOSPITAL COURSE: The patient was admitted to the hospital on 06/25/2013. She had surgery that same day and was brought to the orthopedic floor from the PACU in stable condition. On postoperative day 1, the patient's vital signs and her labs were stable. She did have acute postoperative blood loss anemia with a hemoglobin of 10.7. The patient was asymptomatic. The patient had good progress with physical therapy, after postoperative day 3, the patient was ambulating 200 feet with crutches. On postoperative day 4, the patient was doing well  and ready for discharge to home with home health PT. Her vital signs were stable.   CONDITION AT DISCHARGE: Stable.   DISCHARGE INSTRUCTIONS: The patient can gradually increase weight-bearing on the affected extremity. She needs to wear thigh-high TED hose on both legs and remove at bedtime and replace when arising the next morning. Use incentive spirometry every hour while awake and encouraged cough and deep breathing. She may resume a regular diet as tolerated. Apply an ice pack to the affected area. Do not get the dressing or bandage wet or dirty. Call [facility] ortho if the dressing gets water under it. Leave the dressing on. Call [facility] ortho if any of the following occur: Bright red bleeding from the incision or wound, fever above 101.5 degrees, redness, swelling, or drainage at the incision. Call [facility] ortho if you experience any increased leg pain, numbness or weakness in your legs or bowel or bladder symptoms. Home physical therapy has been arranged for continuation of her rehab program. She needs to call [facility] ortho if a therapist has not contacted her within 48 hours of her return home. She should call for appointment in 2 weeks to return to [facility] ortho.   DISCHARGE MEDICATIONS: Lovastatin 20 mg 1 tablet orally once a day, trazodone 150 mg 1-1/2 tablet orally once a day at bedtime, loratadine 10 mg 1 tablet orally once a day, Celexa 40 mg 1-1/2 tabs orally once a day, vitamin B complex 1 tablet orally once a day, lysine 1000 mg tablet 1 tablet orally once a day, probiotic formula 2 caps orally once a day, Metamucil one dose orally  2 times a day, lorazepam 0.25 mg orally as needed, Tylenol 500 mg 1 tablet orally every 4 hours as needed for pain or temp greater than 100.4, oxycodone 5 mg 1 tablet orally every 4 hours as needed for pain, magnesium hydroxide 8% oral suspension 30 mL orally 2 times a day as needed for constipation, Lovenox 40 mg subcutaneous once a day for 14 days, bisacodyl 10 mg rectal suppository 1  suppository rectally once a day as needed for constipation, docusate/senna 50 mg/8.6 mg oral tablet 1 tablet orally 2 times a day.  ____________________________ T. Cranston Neighborhris Amyria Komar, PA-C tcg:aw D: 06/28/2013 17:57:19 ET T: 06/29/2013 06:47:12 ET JOB#: [id number]  cc: T. Cranston Neighborhris Vitalia Stough, PA-C, <Dictator> Evon SlackHOMAS C Laparis Durrett GeorgiaPA ELECTRONICALLY SIGNED 07/07/2013 7:58

## 2015-03-04 NOTE — Op Note (Signed)
PATIENT NAME:  Kristen Nash, Kristen C MR#:  [id number] DATE OF BIRTH:  [birth year]  DATE OF PROCEDURE:  06/25/2013  PREOPERATIVE DIAGNOSIS: Right hip severe osteoarthritis, possible avascular necrosis.   POSTOPERATIVE DIAGNOSIS: Right hip severe osteoarthritis, possible avascular necrosis.    PROCEDURE: Right total hip replacement.   SURGEON: Leitha SchullerMichael J. Uri Covey, M.D.   ASSISTANT: Devota PaceApril Berndt, NP.   ANESTHESIA: Spinal.   DESCRIPTION OF PROCEDURE: The patient was brought to the operating room and after adequate spinal anesthesia was obtained, the patient was placed on the operative table in supine position with the Medacta attachment withy left leg on the table that was well padded. After bringing in the C-arm with good visualization, AP projection and permanent view was obtained showing the hip and templating off of this for the procedure as a baseline. The hip was then prepped and draped in the usual sterile method. Direct anterior approach was made with the incision centered over the greater trochanter. Incision was carried down through the skin and subcutaneous tissues. Hemostasis being achieved with electrocautery. The tensor muscle belly fascia was incised and the muscle retracted laterally. The deep fascia was then incised and the rectus sheath opened and the rectus retracted medially. There was a very large lateral circumflex vessel, which was more distal than usual and this bled a fair amount, but it was controlled and ligated. A capsulotomy was then carried out and deep Charnley retractor placed. The femoral neck cut was made and the head removed. It showed complete eburnation of the head with severe deformity. The labrum was excised along with scar tissue within the cotyloid notch. Reaming was carried out to 50 mm, at which point there was good bleeding bone and the cup was medialized. A 50 mm trial fit well and the 50 mm cup was impacted in place and looked stable with appropriate anteversion.    Next, a posterior release was carried out. There was a very tight posterior capsule and the pubofemoral and ischiofemoral ligaments released to allow for mobilization of the proximal femur. The hip was brought into external rotation, extension and adduction. A box osteotome was used followed by sequential broaching up to a size 3. Trials off the 3 gave equal leg lengths to the preop x-ray with a short head and the appropriate dual mobility liner. Final components were impacted and the hip reduced. The hip was stable to a 90 degree external rotation test and again with final implants in place, offset and  length appeared appropriate. The wound was thoroughly irrigated and closed with a heavy quill suture followed by subcutaneous drain, 2-0 quill subcutaneously and skin staples.   COMPLICATIONS: None.   SPECIMENS: Femoral head.   ESTIMATED BLOOD LOSS: 1100 mL.  IMPLANTS: Medacta dual mobility liner for a 50 mm DM Versafit cup with size 3 standard AMIS stem and an S-28 mm femoral head.   CONDITION: To recovery room stable.   ____________________________ Leitha SchullerMichael J. Mindie Rawdon, MD mjm:aw D: 06/25/2013 11:28:52 ET T: 06/25/2013 11:42:43 ET JOB#: [id number]  cc: Leitha SchullerMichael J. Geovonni Meyerhoff, MD, <Dictator> Leitha SchullerMICHAEL J Sadonna Kotara MD ELECTRONICALLY SIGNED 06/25/2013 14:21

## 2015-03-04 NOTE — Discharge Summary (Signed)
PATIENT NAME:  [name], Averiana C MR#:  [id number] DATE OF BIRTH:  08/16/1951  DATE OF ADMISSION:  09/08/2013 DATE OF DISCHARGE: 09/14/2013   BRIEF HISTORY: Kristen Nash is a 64 year old woman seen in the Emergency Room with a several-day history of abdominal pain. CT scanning demonstrated some free air in the right lower quadrant and, what appeared to be, an inflammatory mass and possible foreign body. She had an elevated white blood cell count and peritoneal signs on ER evaluation, so she was taken to surgery that evening where she underwent a laparotomy. There appeared to be a small bowel perforation and a Meckel's diverticulum from, what appeared to be, a chicken bone. Because there was a significant area of bowel that was inflamed and it was unclear exactly where the perforation occurred, a small segment of ileum was removed. There did appear to be a classic Meckel's diverticulum in the specimen. The foreign body was removed with the specimen. A side-to-side anastomosis was created. She had slow return of bowel function and was able to tolerate a liquid diet by the 31st and advanced to a soft diet on the 1st. She developed some urinary tract symptoms and urinalysis today did reveal multiple white cells and positive leukocyte esterase in the urine. She is discharged home today to be followed in the office in 7 to 10 days' time. Bathing, activity, and driving instructions were given to the patient. She has no particular wound problems.   DISCHARGE MEDICATIONS: Include lorazepam 0.25 mg p.r.n., lovastatin 20 mg once a day, trazodone 150 mg bedtime, loratadine 10 mg once a day, Celexa 60 mg once a day, B vitamin 1 tablet every day, lysine 1000 mg once a day, probiotic 2 capsules once a day, Metamucil one dose b.i.d., magnesium hydroxide 8% oral solution 30 mL twice a day, Bentyl 20 mg q.i.d. p.r.n., lactulose 10 grams in 15 mL, 30 mL once a day, acetaminophen 5/325 mg every 4 to 6 hours p.r.n. and Cipro 500 mg  p.o. b.i.d. for 10 days.   FINAL DISCHARGE DIAGNOSIS: Meckel's diverticulum with perforated small bowel, interloop abscess, foreign body and urinary tract infection.  SURGERIES: Exploratory laparotomy, small bowel resection.   ____________________________ Carmie Endalph L. Ely III, MD rle:aw D: 09/14/2013 10:23:19 ET T: 09/14/2013 11:01:58 ET JOB#: [id number]  cc: Carmie Endalph L. Ely III, MD, <Dictator> Marya AmslerMarshall W. Dareen PianoAnderson, MD Quentin OreALPH L ELY MD ELECTRONICALLY SIGNED 09/18/2013 22:42

## 2015-03-04 NOTE — Op Note (Signed)
PATIENT NAME:  Kristen Nash, Kristen Nash MR#:  [id number] DATE OF BIRTH:  [birth year]  DATE OF PROCEDURE:  09/10/2013  PREOPERATIVE DIAGNOSES:   1.  Perforated Meckel's diverticulum.  2.  Peritonitis.   POSTOPERATIVE DIAGNOSES:   1.  Perforated Meckel's diverticulum.  2.  Peritonitis.   PROCEDURES:  1. Ultrasound guidance for vascular access to the left basilic vein.  2. Fluoroscopic guidance for placement of catheter.  3. Insertion of a double-lumen peripherally inserted central catheter line, left arm.  SURGEON: Renford DillsGregory G. Schnier, MD   ANESTHESIA: Local.   ESTIMATED BLOOD LOSS: Minimal.   INDICATION FOR PROCEDURE: Requiring IV antibiotics greater than 5 days.   DESCRIPTION OF PROCEDURE: The patient's left arm was sterilely prepped and draped, and a sterile surgical field was created. The basilic vein was accessed under direct ultrasound guidance without difficulty with a micropuncture needle and permanent image was recorded. 0.018 wire was then placed into the superior vena cava. Peel-away sheath was placed over the wire. A single lumen peripherally inserted central venous catheter was then placed over the wire and the wire and peel-away sheath were removed. The catheter tip was placed into the superior vena cava and was secured at the skin at 36 cm with a sterile dressing. The catheter withdrew blood well and flushed easily with heparinized saline. The patient tolerated procedure well.  ____________________________ Renford DillsGregory G. Schnier, MD ggs:jcm D: 09/25/2013 15:09:19 ET T: 09/25/2013 22:14:33 ET JOB#: [id number]  cc: Renford DillsGregory G. Schnier, MD, <Dictator> Renford DillsGREGORY G SCHNIER MD ELECTRONICALLY SIGNED 10/19/2013 17:15

## 2015-03-04 NOTE — Op Note (Signed)
PATIENT NAME:  Kristen Nash, Kristen Nash MR#:  [id number] DATE OF BIRTH:  [birth year]  DATE OF PROCEDURE:  09/07/2013  PREOPERATIVE DIAGNOSIS: Perforated small intestine.   POSTOPERATIVE DIAGNOSIS: Foreign body perforation, Meckel's diverticulum.  PROCEDURE PERFORMED: Exploratory laparotomy and small bowel resection.   SURGEON: Quentin Orealph L. Ely, MD  ANESTHESIA: General.   OPERATIVE PROCEDURE: With the patient in the supine position after induction of appropriate general anesthesia, the patient's abdomen was prepped with ChloraPrep and draped with sterile towels. An alcohol wipe and Betadine impregnated Steri-Drape were utilized. A midline incision was made from just above the umbilicus to just below the umbilicus and carried down through the subcutaneous tissue with Bovie electrocautery. Midline fascia was identified and opened the length of the skin incision as was the peritoneum. No obvious free air was encountered. No obvious significant fluid was encountered. The small bowel was elevated into the incision and a large inflammatory mass was identified in the right lower quadrant. It was elevated into the incision. There were multiple loops of bowel which were adherent to each other. There was an obvious Meckel's diverticulum. The diverticulum appeared to have a small foreign body perforated through the wall. There was an abscessed cavity in the small bowel adjacent to it. Because of the inflammatory changes in the adjacent bowel and because I was unsure exactly where the perforation had occurred with the foreign body, we elected to remove that entire 8 to 10 inch segment of bowel. The bowel was divided on each end with the GIA-55 stapling device and the mesentery was taken down with the LigaSure apparatus. The specimen was opened and the foreign body identified. It did appear to be a chicken bone. Gloves were changed. A small enterotomy was made in the antimesenteric border of each loop of bowel and the GIA-55  stapling device inserted, 1 limb into each loop of bowel. The stapler was approximated and fired. There was a common channel identified which was not bleeding. The enterotomy was closed with a single application of TX-30 stapling device carrying a blue load. The staple lines were oversewn and the apex bolster suture of 3-0 silk was placed. The mesenteric defect was closed with 3-0 Vicryl. The area appeared to be satisfactorily hemostatic. It was placed back in the abdomen and the area copiously irrigated with warm saline solution. Small bowel was then run from ligament of Treitz to the ileocecal valve. The defect requiring resection appeared to at the distal ileum approximately 6 to 8 inches from the ileocecal valve. No other abnormalities were noted other than some mild diverticulosis. The abdomen was then irrigated and bowel contents returned to their anatomic position. The midline fascia was closed with running interlocking suture of zero looped PDS. The skin was clipped, sterile dressings applied, and the patient returned to the recovery room having tolerated the procedure well. Sponge, instrument and needle counts were correct x 2 in the operating room.  ____________________________ Carmie Endalph L. Ely III, MD rle:sb D: 09/08/2013 00:56:00 ET T: 09/08/2013 07:06:39 ET JOB#: [id number]  cc: Carmie Endalph L. Ely III, MD, <Dictator> Marya AmslerMarshall W. Dareen PianoAnderson, MD Quentin OreALPH L ELY MD ELECTRONICALLY SIGNED 09/08/2013 20:35

## 2015-03-05 NOTE — Discharge Summary (Signed)
PATIENT NAME:  Kristen Nash, Kristen C MR#:  [id number] DATE OF BIRTH:  08-01-1951  DATE OF ADMISSION:  11/10/2013 DATE OF DISCHARGE:  11/13/2013   ADMITTING DIAGNOSIS: Left knee severe osteoarthritis.   DISCHARGE DIAGNOSIS: Left knee severe osteoarthritis.   PROCEDURE: Left total knee replacement.   ANESTHESIA: Spinal.   SURGEON: Leitha SchullerMichael J. Menz, MD  ASSISTANT: Cranston Neighborhris Gaines, PA-C   ESTIMATED BLOOD LOSS: 150 mL.   TOURNIQUET TIME: 107 minutes.   IMPLANTS: GMK Sphere primary left femur size 3, left 3 fixed tibial component with a 12 mm Flex insert and a size 2 patella from Medacta.   SPECIMEN: Cut ends of bone.   CONDITION: To recovery room stable.   COMPLICATIONS: There were no complications.   HISTORY: The patient is a 64 year old female who has been medically cleared for surgery. She has got left knee osteoarthritis that has been progressively getting worse. She has had a MyKnee CT that showed severe tricompartmental changes as well as a screw that will need to be removed. She has had prior patellar tubercle osteotomy with a lateral parapatellar incision that we will need to use for her surgery.   PHYSICAL EXAMINATION:  LUNGS: Clear.  HEART: Regular rate and rhythm. Heart normal.  LEFT LOWER EXTREMITY: She has range of motion of 10 to 40 to 45 degrees. She has crepitation with range of motion and a very antalgic gait. She has pain at rest and a great deal of difficulty doing activities of daily living. Just taking care of herself around her home has become limited due to her knee arthritis. On exam, she has 5 to 10 to 40 to 45 degrees range of motion. There is no instability. Distally, she has palpable pulses. Sensation is intact. She has no pain with hip rotation.   HOSPITAL COURSE: The patient was admitted to the hospital on 11/10/2013. She had surgery that same day and was brought to the orthopedic floor from the PACU in stable condition. Pain was controlled on postoperative day 1, and  vital signs were stable. Potassium was low to 3.0, and hemoglobin was down to 9.0 on postoperative day 1. She is given potassium supplementation. On postoperative day 2, potassium was up to 3.2, and hemoglobin was down to 8.1. Later that day, hemoglobin was checked and was up to 8.7. The patient was doing well, vital signs were stable, pain was controlled, and she was ambulating 200 feet with therapy and as well had 90 degrees range of motion of the knee. She was doing very well and pleased with the surgery. She was ready for discharge to home with home health PT.    DISCHARGE INSTRUCTIONS: The patient may gradually increase weight-bearing on the affected extremity. Elevate the affected foot or leg on 1 or 2 pillows, with the foot higher than the knee. Thigh-high TED hose on both legs and remove at bedtime and replace when arising the next morning. Elevate the heels off the bed. Use incentive spirometry every hour while awake and encouraged cough and deep breathing. May resume a regular diet as tolerated. No concentrated sweets or sugar. Continue using Polar Care unit, maintaining temperature between 40 and 50 degrees. Do not get the dressing bandage wet or dirty. [facility] if the dressing gets water under it. Leave the dressing on. Call [facility] if any of the following occur: Bright red bleeding from the incision or wound, fever above 101 degrees, redness, swelling or drainage at the incision. [facility] if you experience  any increased leg pain, numbness or weakness in your legs or bowel or bladder symptoms. Home physical therapy has been arranged for continuation of rehab program. Please call [facility] if a therapist has not contacted you within 48 hours of your return home. Follow up with [facility] in 2 weeks.   DISCHARGE MEDICATIONS:  1. Lorazepam 0.25 mg orally as needed.  2. Lovastatin 20 mg 1 tablet orally once a day.  3. Trazodone 150 mg oral tablet 1.5 tab orally  once a day.  4. Loratadine 10 mg 1 tablet orally once a day.  5. Celexa 40 mg 1.5 tabs orally once a day.  6. Super V vitamin B complex 1 tablet orally once a day.  7. Lysine 1000 mg 1 tablet orally once a day. 8. Metamucil 3.4/12 grams oral powder 1 dose orally 2 times a day. 9. Docusate/senna 50 mg/8.6 mg tablet 1 tablet orally 2 times a day. 10. Bentyl 20 mg 1 cap orally 4 times a day. 11. Augmentin 1 tablet orally 2 times a day.  12. Probiotic formula 1 cap orally once a day. 13. Tylenol 500 mg 1 tab orally every 4 hours.  14. Oxycodone 5 mg 1 to 2 tabs orally every 4 to 6 hours as needed for pain.  15. Tramadol 50 mg 1 to 2 tablets orally every 4 to 6 hours as needed for pain.  16. Milk of Magnesia 30 mL orally 2 times a day as needed for constipation. 17. Mylanta 30 mL orally every 6 hours as needed for indigestion or heartburn. 18. Xarelto 1 tablet orally once a day in the morning for 9 days.  19. Ferrous sulfate 325 mg 1 tab orally 2 times a day with meals.   ____________________________ T. Cranston Neighbor, PA-C tcg:lb D: 11/13/2013 08:25:13 ET T: 11/13/2013 09:40:23 ET JOB#: [id number]  cc: T. Cranston Neighbor, PA-C, <Dictator> Evon Slack [place] ELECTRONICALLY SIGNED 12/06/2013 2:30
